# Patient Record
Sex: Female | Born: 2005 | Race: White | Hispanic: Yes | Marital: Single | Smoking: Never smoker
Health system: Southern US, Community
[De-identification: ages and names within clinical notes are randomized; demographics above are authoritative.]

---
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
## 2023-04-06 IMAGING — DX DG HAND COMPLETE 3+V*R*
3 series · 3 of 3 positions shown · non-contrast
Comparison: None.

CLINICAL DATA: Rat bite between the 1st and 2nd fingers.

EXAM:
RIGHT HAND - COMPLETE 3+ VIEW

[hand ap]
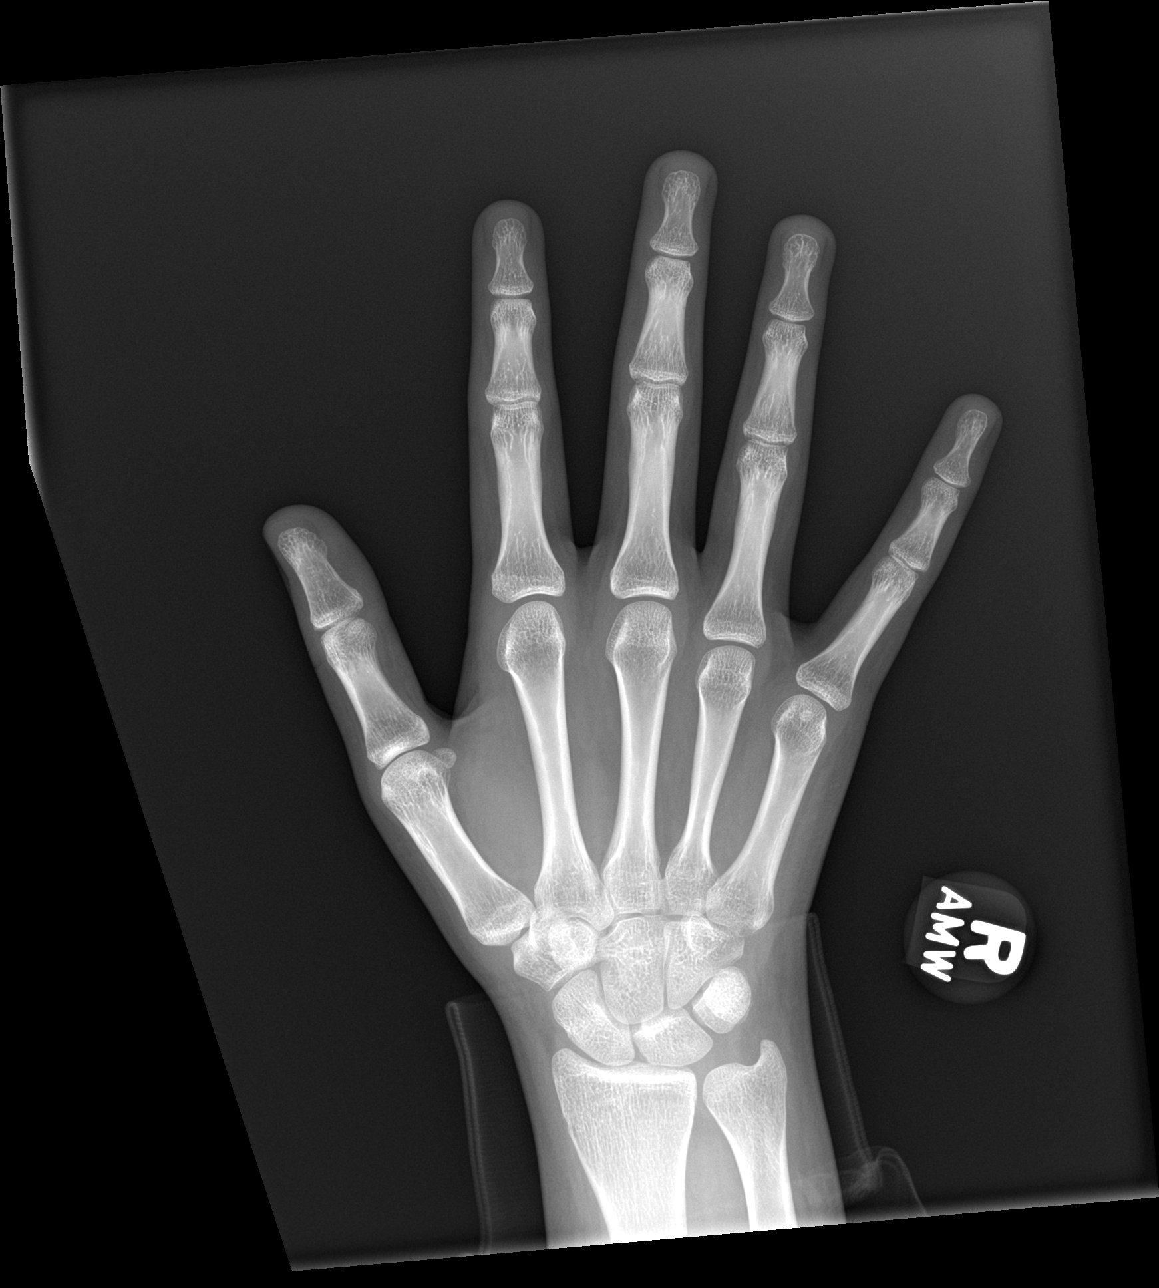

[hand obl]
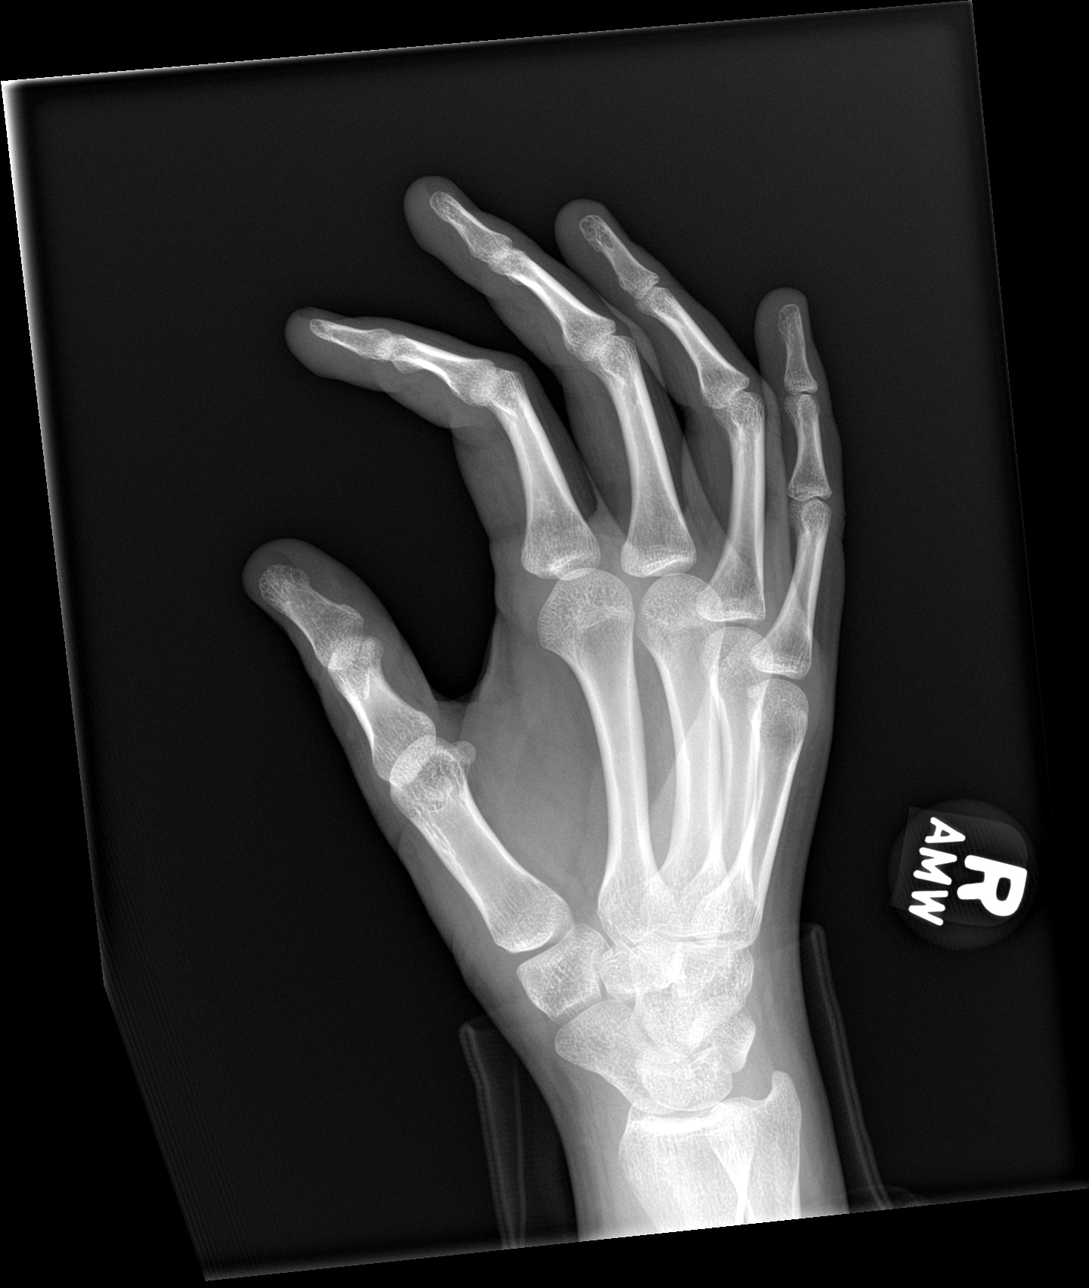

[hand lat]
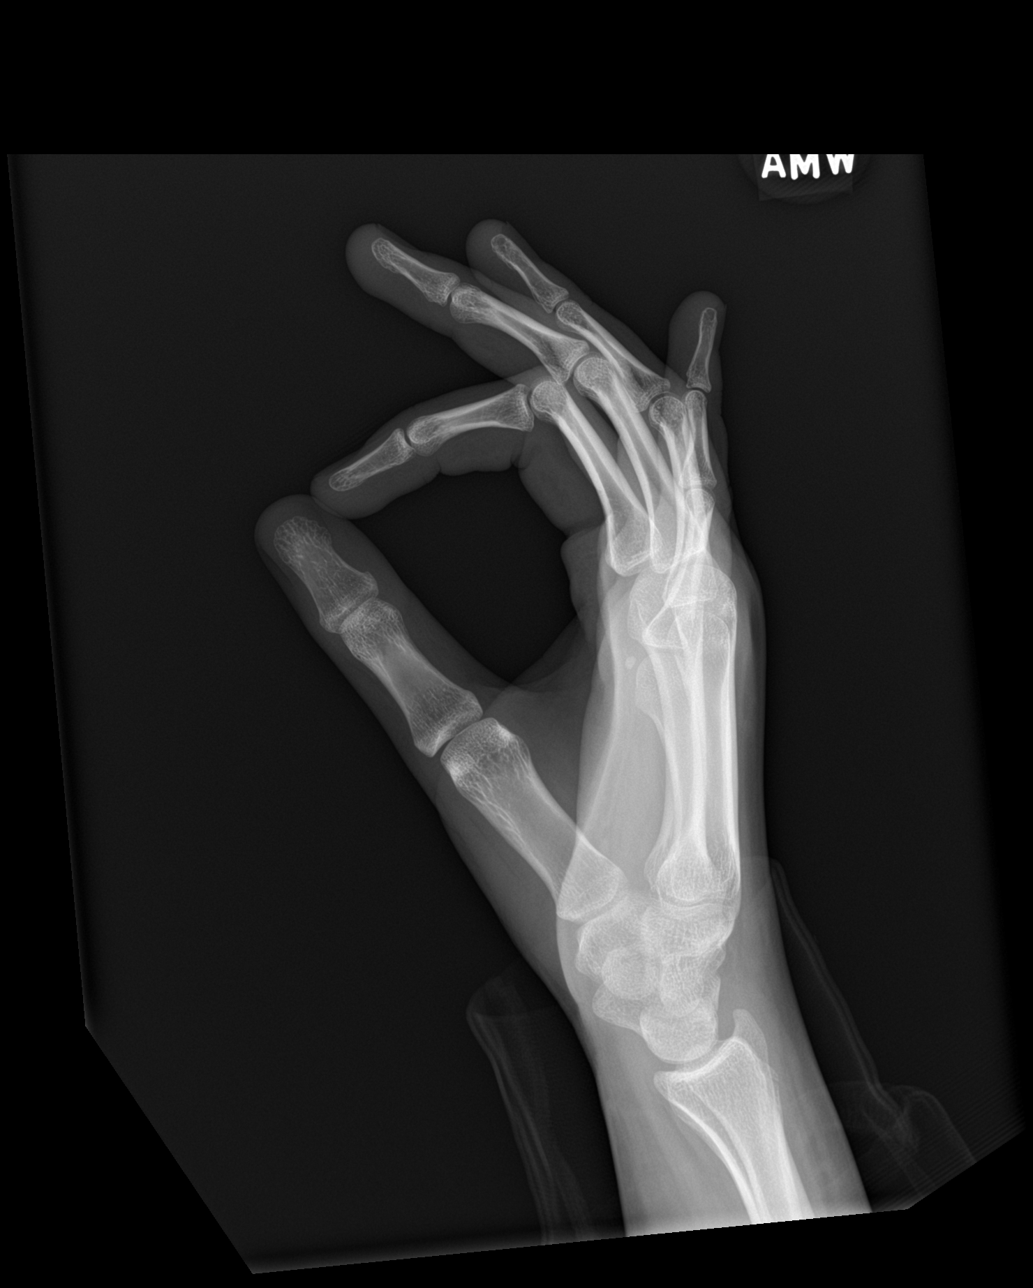

[3 of 3 positions shown; findings below may reference images not displayed]

FINDINGS: The mineralization and alignment are normal. There is no evidence of
acute fracture or dislocation. The joint spaces are preserved. No
erosive changes, soft tissue emphysema or foreign bodies identified.
IMPRESSION: No acute osseous findings, foreign body or soft tissue emphysema.
